# Patient Record
Sex: Female | Born: 2005
Health system: Southern US, Community
[De-identification: ages and names within clinical notes are randomized; demographics above are authoritative.]

## PROBLEM LIST (undated history)

## (undated) DIAGNOSIS — T7840XA Allergy, unspecified, initial encounter: Secondary | ICD-10-CM

## (undated) DIAGNOSIS — F419 Anxiety disorder, unspecified: Secondary | ICD-10-CM

## (undated) DIAGNOSIS — Z789 Other specified health status: Secondary | ICD-10-CM

## (undated) HISTORY — DX: Allergy, unspecified, initial encounter: T78.40XA

## (undated) HISTORY — DX: Anxiety disorder, unspecified: F41.9

## (undated) HISTORY — PX: TONSILECTOMY, ADENOIDECTOMY, BILATERAL MYRINGOTOMY AND TUBES: SHX2538

---
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
## 2024-01-07 ENCOUNTER — Other Ambulatory Visit: Payer: Self-pay

## 2024-01-07 ENCOUNTER — Other Ambulatory Visit (HOSPITAL_COMMUNITY): Payer: Self-pay

## 2024-01-07 MED ORDER — VALACYCLOVIR HCL 1 G PO TABS
2.0000 g | ORAL_TABLET | Freq: Two times a day (BID) | ORAL | 0 refills | Status: DC
  Filled 2024-01-07: qty 4, 1d supply, fill #0

## 2024-01-08 ENCOUNTER — Other Ambulatory Visit (HOSPITAL_COMMUNITY): Payer: Self-pay

## 2024-01-09 ENCOUNTER — Other Ambulatory Visit (HOSPITAL_COMMUNITY): Payer: Self-pay

## 2024-01-09 ENCOUNTER — Other Ambulatory Visit: Payer: Self-pay

## 2024-01-09 MED ORDER — VALACYCLOVIR HCL 1 G PO TABS
2000.0000 mg | ORAL_TABLET | Freq: Two times a day (BID) | ORAL | 4 refills | Status: AC
  Filled 2024-01-09: qty 20, 5d supply, fill #0
  Filled 2024-01-10: qty 20, 5d supply, fill #1
  Filled 2024-01-15 – 2024-01-20 (×3): qty 20, 5d supply, fill #2
  Filled 2024-01-21: qty 20, 5d supply, fill #3

## 2024-01-14 ENCOUNTER — Other Ambulatory Visit (HOSPITAL_COMMUNITY): Payer: Self-pay

## 2024-01-15 ENCOUNTER — Other Ambulatory Visit (HOSPITAL_COMMUNITY): Payer: Self-pay

## 2024-01-16 ENCOUNTER — Other Ambulatory Visit (HOSPITAL_COMMUNITY): Payer: Self-pay

## 2024-01-21 ENCOUNTER — Encounter: Payer: Self-pay | Admitting: Family

## 2024-01-21 ENCOUNTER — Ambulatory Visit (INDEPENDENT_AMBULATORY_CARE_PROVIDER_SITE_OTHER): Admitting: Family

## 2024-01-21 ENCOUNTER — Other Ambulatory Visit (HOSPITAL_COMMUNITY): Payer: Self-pay

## 2024-01-21 VITALS — BP 114/77 | HR 78 | Temp 97.5°F | Ht 65.0 in | Wt 146.5 lb

## 2024-01-21 DIAGNOSIS — Z13220 Encounter for screening for lipoid disorders: Secondary | ICD-10-CM | POA: Diagnosis not present

## 2024-01-21 DIAGNOSIS — Z1159 Encounter for screening for other viral diseases: Secondary | ICD-10-CM | POA: Diagnosis not present

## 2024-01-21 DIAGNOSIS — R41840 Attention and concentration deficit: Secondary | ICD-10-CM | POA: Diagnosis not present

## 2024-01-21 DIAGNOSIS — B001 Herpesviral vesicular dermatitis: Secondary | ICD-10-CM | POA: Diagnosis not present

## 2024-01-21 DIAGNOSIS — Z0000 Encounter for general adult medical examination without abnormal findings: Secondary | ICD-10-CM

## 2024-01-21 LAB — CBC WITH DIFFERENTIAL/PLATELET
Basophils Absolute: 0 K/uL (ref 0.0–0.1)
Basophils Relative: 0.5 % (ref 0.0–3.0)
Eosinophils Absolute: 0.1 K/uL (ref 0.0–0.7)
Eosinophils Relative: 2.6 % (ref 0.0–5.0)
HCT: 38.2 % (ref 36.0–49.0)
Hemoglobin: 12.8 g/dL (ref 12.0–16.0)
Lymphocytes Relative: 29.2 % (ref 24.0–48.0)
Lymphs Abs: 1.6 K/uL (ref 0.7–4.0)
MCHC: 33.6 g/dL (ref 31.0–37.0)
MCV: 87.5 fl (ref 78.0–98.0)
Monocytes Absolute: 0.3 K/uL (ref 0.1–1.0)
Monocytes Relative: 5.4 % (ref 3.0–12.0)
Neutro Abs: 3.5 K/uL (ref 1.4–7.7)
Neutrophils Relative %: 62.3 % (ref 43.0–71.0)
Platelets: 337 K/uL (ref 150.0–575.0)
RBC: 4.37 Mil/uL (ref 3.80–5.70)
RDW: 13.2 % (ref 11.4–15.5)
WBC: 5.6 K/uL (ref 4.5–13.5)

## 2024-01-21 LAB — COMPREHENSIVE METABOLIC PANEL WITH GFR
ALT: 9 U/L (ref 0–35)
AST: 15 U/L (ref 0–37)
Albumin: 4 g/dL (ref 3.5–5.2)
Alkaline Phosphatase: 42 U/L — ABNORMAL LOW (ref 47–119)
BUN: 11 mg/dL (ref 6–23)
CO2: 27 meq/L (ref 19–32)
Calcium: 9.2 mg/dL (ref 8.4–10.5)
Chloride: 102 meq/L (ref 96–112)
Creatinine, Ser: 0.82 mg/dL (ref 0.40–1.20)
GFR: 103.83 mL/min (ref >60.00)
Glucose, Bld: 81 mg/dL (ref 70–99)
Potassium: 4.2 meq/L (ref 3.5–5.1)
Sodium: 135 meq/L (ref 135–145)
Total Bilirubin: 0.5 mg/dL (ref 0.2–1.2)
Total Protein: 7.6 g/dL (ref 6.0–8.3)

## 2024-01-21 LAB — TSH: TSH: 1.8 u[IU]/mL (ref 0.40–5.00)

## 2024-01-21 LAB — LIPID PANEL
Cholesterol: 242 mg/dL — ABNORMAL HIGH (ref 0–200)
HDL: 58.5 mg/dL (ref >39.00)
LDL Cholesterol: 149 mg/dL — ABNORMAL HIGH (ref 0–99)
NonHDL: 183.26
Total CHOL/HDL Ratio: 4
Triglycerides: 172 mg/dL — ABNORMAL HIGH (ref 0.0–149.0)
VLDL: 34.4 mg/dL (ref 0.0–40.0)

## 2024-01-21 NOTE — Patient Instructions (Addendum)
 It was very nice to see you today!   I will review your lab results via MyChart in a few days.  If you want to talk more about ADHD you can schedule a follow up visit when convenient.  You look great! Stay well!      PLEASE NOTE:  If you had any lab tests please let us  know if you have not heard back within a few days. You may see your results on MyChart before we have a chance to review them but we will give you a call once they are reviewed by us . If we ordered any referrals today, please let us  know if you have not heard from their office within the next week.

## 2024-01-21 NOTE — Progress Notes (Signed)
 Phone [phone]  Subjective:   Patient is a 18 y.o. female presenting for annual physical.    Chief Complaint  Patient presents with   New Patient (Initial Visit)   Annual Exam    Fasting w/ labs  Discussed the use of AI scribe software for clinical note transcription with the patient, who gave verbal consent to proceed.  History of Present Illness Katherine Vincent is a 18 year old female who presents for an annual physical exam.  Menstrual cycle and contraceptive management - Takes Sprintec  daily for menstrual pain management and cycle regulation - Plans for possible Pap smear in early August - Believes HPV vaccine series completed, just one vaccine showing in our EMR  Herpetic labialis prophylaxis - Uses Valtrex  prophylactically to prevent lip flare-ups associated with sun exposure  Attention and concentration difficulties - Experiences difficulty sitting still and focusing - Symptoms have become more noticeable since starting college  General health maintenance - Maintains regular bowel movements - Aims to drink two liters of water daily - Limits alcohol intake - No tobacco or vaping use  See problem oriented charting- ROS- full  review of systems was completed and negative except for what is noted in HPI above.  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  Past Surgical History:  Procedure Laterality Date   TONSILECTOMY, ADENOIDECTOMY, BILATERAL MYRINGOTOMY AND TUBES      Family History  Problem Relation Age of Onset   Hypertension Mother    Arthritis Mother    Hypertension Father    Arthritis Father    Heart disease Father    Stroke Father    ADD / ADHD Sister    Asthma Maternal Aunt    Diabetes  Maternal Aunt    Miscarriages / Stillbirths Maternal Aunt    Cancer Paternal Uncle    Alcohol abuse Paternal Uncle    Cancer Maternal Grandmother    Diabetes Maternal Grandmother    Hearing loss Maternal Grandmother    Alcohol abuse Maternal Grandfather    Cancer Maternal Grandfather    Cancer Paternal Grandmother    Heart disease Paternal Grandfather     Medications- reviewed and updated Current Outpatient Medications  Medication Sig Dispense Refill   SPRINTEC  28 0.25-35 MG-MCG tablet Take 1 tablet by mouth daily. 84 tablet 2   SPRINTEC  28 0.25-35 MG-MCG tablet Take 1 tablet by mouth daily. 84 tablet 1   valACYclovir  (VALTREX ) 1000 MG tablet Take 2 tablets (2,000 mg total) by mouth 2 (two) times daily. 20 tablet 4   valACYclovir  (VALTREX ) 1000 MG tablet Take 2 tablets (2,000 mg total) by mouth 2 (two) times daily. 20 tablet 4   No current facility-administered medications for this visit.    Allergies-reviewed and updated Allergies  Allergen Reactions   Norgestimate -Eth Estradiol      negative mood effects   Amoxicillin Rash    Did it involve swelling of the face/tongue/throat, SOB, or low BP? No Did it involve sudden or severe rash/hives, skin peeling, or any reaction on the inside of your mouth or nose? No Did you need to seek medical attention at a hospital or doctor's office? No When did it last happen?  >10years (about 72yrs of age)  If all above answers are "NO", may proceed with cephalosporin use.     Social History   Social History Narrative   Katherine Vincent lives at home with her mother and father. Pets in home include 1 dog.     Objective:  BP 114/77 (BP Location: Left Arm, Patient Position: Sitting, Cuff Size: Large)   Pulse 78   Temp (!) 97.5 F (36.4 C) (Temporal)   Ht 5' 5 (1.651 m)   Wt 146 lb 8 oz (66.5 kg)   LMP 12/22/2023 (Approximate)   BMI 24.38 kg/m  Physical Exam Vitals and nursing note reviewed.  Constitutional:      Appearance: Normal  appearance.  HENT:     Head: Normocephalic.     Right Ear: Tympanic membrane normal.     Left Ear: Tympanic membrane normal.     Nose: Nose normal.     Mouth/Throat:     Mouth: Mucous membranes are moist.  Eyes:     Pupils: Pupils are equal, round, and reactive to light.  Cardiovascular:     Rate and Rhythm: Normal rate and regular rhythm.  Pulmonary:     Effort: Pulmonary effort is normal.     Breath sounds: Normal breath sounds.  Musculoskeletal:        General: Normal range of motion.     Cervical back: Normal range of motion.  Lymphadenopathy:     Cervical: No cervical adenopathy.  Skin:    General: Skin is warm and dry.  Neurological:     Mental Status: She is alert.  Psychiatric:        Mood and Affect: Mood normal.        Behavior: Behavior normal.     Assessment and Plan   Health Maintenance counseling: 1. Anticipatory guidance: Patient counseled regarding regular dental exams q6 months, eye exams,  avoiding smoking and second hand smoke, limiting alcohol to 1 beverage per day, no illicit drugs.   2. Risk factor reduction:  Advised patient of need for regular exercise and diet rich with fruits and vegetables to reduce risk of heart attack and stroke. Wt Readings from Last 3 Encounters:  01/21/24 146 lb 8 oz (66.5 kg) (78%, Z= 0.78)*  08/07/19 125 lb 3.5 oz (56.8 kg) (69%, Z= 0.51)*  08/15/16 120 lb 3.2 oz (54.5 kg) (90%, Z= 1.28)*   * Growth percentiles are based on CDC (Girls, 2-20 Years) data.   3. Immunizations/screenings/ancillary studies Immunization History  Administered Date(s) Administered   DTaP 01/02/2005, 03/07/2005, 05/09/2005, 03/19/2006, 11/01/2008   HIB (PRP-OMP) 01/02/2005, 03/07/2005, 03/19/2006   HPV 9-valent 06/07/2016, 12/12/2016   Hepatitis A, Ped/Adol-2 Dose 10/26/2005, 10/30/2006   Hepatitis B 06-15-2005, 11/28/2004, 08/10/2005   IPV 01/02/2005, 03/07/2005, 08/10/2005, 11/01/2008   Influenza Inj Mdck Quad Pf 06/09/2018    Influenza,inj,Quad PF,6+ Mos 04/28/2015   Influenza-Unspecified 06/23/2019   MMR 10/26/2005, 11/01/2008   MenQuadfi_Meningococcal Groups ACYW Conjugate 12/06/2021   Meningococcal B, OMV 12/06/2021, 12/07/2022   Pneumococcal Conjugate PCV 7 01/02/2005, 03/07/2005, 05/09/2005, 10/26/2005   Tdap 06/07/2016   Varicella 10/26/2005, 11/01/2008   There are no preventive care reminders to display for this patient.   4. Cervical cancer screening: never - est with GYN, will discuss starting in Aug 5. Skin cancer screening- advised regular sunscreen use. Denies worrisome, changing, or new skin lesions.  6. Birth control/STD check: OCPs 7. Smoking associated screening: never smoked 8. Alcohol screening: rare   Assessment & Plan Attention Deficit Hyperactivity Disorder (ADHD) - Suspected Discussed potential stimulant  medications, side effects, and monitoring requirements. Reviewed non-pharmacological strategies for focus and attention. - Provided educational material on focus and attention strategies. - Offer ADHD screening in a future visit. - Discuss stimulant medications and side effects in a follow-up visit.  Herpes Labialis Uses Valtrex  prophylactically during sun exposure to prevent outbreaks. - Refill Valtrex  prescription when needed for prophylactic use during sun exposure.  General Health Maintenance 18 year old female, on Sprintec  for contraception, sexually active, plans for Pap smear in August, completed HPV vaccination. Vitals normal. - Perform blood work including cholesterol panel, thyroid, metabolic panel, liver and kidney function, electrolytes, calcium, protein, blood count, and hepatitis C screening. - Advised on maintaining regular bowel movements and adequate hydration. - Counsel on limiting alcohol intake and avoiding smoking, vaping, and illegal substances. - Encourage safety practices when out socially.  Recommended follow up:  Return for any future concerns, Complete  physical w/fasting labs. No future appointments.   Lab/Order associations: fasting    Lucius Krabbe, NP

## 2024-01-22 ENCOUNTER — Ambulatory Visit: Payer: Self-pay | Admitting: Family

## 2024-01-22 LAB — HEPATITIS C ANTIBODY: Hepatitis C Ab: NONREACTIVE

## 2024-02-17 ENCOUNTER — Other Ambulatory Visit (HOSPITAL_COMMUNITY): Payer: Self-pay

## 2024-02-25 ENCOUNTER — Other Ambulatory Visit: Payer: Self-pay

## 2024-05-29 ENCOUNTER — Other Ambulatory Visit (HOSPITAL_COMMUNITY): Payer: Self-pay

## 2024-06-01 ENCOUNTER — Other Ambulatory Visit (HOSPITAL_COMMUNITY): Payer: Self-pay

## 2024-06-03 ENCOUNTER — Other Ambulatory Visit (HOSPITAL_BASED_OUTPATIENT_CLINIC_OR_DEPARTMENT_OTHER): Payer: Self-pay

## 2024-06-03 MED ORDER — FLUZONE 0.5 ML IM SUSY
0.5000 mL | PREFILLED_SYRINGE | Freq: Once | INTRAMUSCULAR | 0 refills | Status: AC
  Filled 2024-06-03: qty 0.5, 1d supply, fill #0

## 2024-06-04 ENCOUNTER — Telehealth: Admitting: Family Medicine

## 2024-06-04 DIAGNOSIS — R6889 Other general symptoms and signs: Secondary | ICD-10-CM

## 2024-06-05 ENCOUNTER — Other Ambulatory Visit (HOSPITAL_COMMUNITY): Payer: Self-pay

## 2024-06-05 ENCOUNTER — Other Ambulatory Visit: Payer: Self-pay

## 2024-06-05 MED ORDER — LIDOCAINE VISCOUS HCL 2 % MT SOLN
10.0000 mL | OROMUCOSAL | 0 refills | Status: AC | PRN
  Filled 2024-06-05 (×2): qty 200, 7d supply, fill #0

## 2024-06-05 MED ORDER — PSEUDOEPH-BROMPHEN-DM 30-2-10 MG/5ML PO SYRP
5.0000 mL | ORAL_SOLUTION | Freq: Four times a day (QID) | ORAL | 0 refills | Status: AC | PRN
  Filled 2024-06-05 (×2): qty 120, 6d supply, fill #0

## 2024-06-05 NOTE — Progress Notes (Signed)
 E visit for Flu like symptoms   We are sorry that you are not feeling well.  Here is how we plan to help! Based on what you have shared with me it looks like you may have flu-like symptoms that should be watched but do not seem to indicate anti-viral treatment.  Influenza or "the flu" is  an infection caused by a respiratory virus. The flu virus is highly contagious and persons who did not receive their yearly flu vaccination may "catch" the flu from close contact.  We have anti-viral medications to treat the viruses that cause this infection. They are not a "cure" and only shorten the course of the infection. These prescriptions are most effective when they are given within the first 2 days of "flu" symptoms. Antiviral medications are indicated if you have a high risk of complications from the flu. You should  also consider an antiviral medication if you are in close contact with someone who is at risk. These medications can help patients avoid complications from the flu but have side effects that you should know.   Possible side effects from Tamiflu or oseltamivir include nausea, vomiting, diarrhea, dizziness, headaches, eye redness, sleep problems or other respiratory symptoms. You should not take Tamiflu if you have an allergy to oseltamivir or any to the ingredients in Tamiflu.   For nasal congestion, you may use an oral decongestant such as Mucinex D or if you have glaucoma or high blood pressure use plain Mucinex.  Saline nasal spray or nasal drops can help and can safely be used as often as needed for congestion.  If you have a sore or scratchy throat, use a saltwater gargle-  to  teaspoon of salt dissolved in a 4-ounce to 8-ounce glass of warm water.  Gargle the solution for approximately 15-30 seconds and then spit.  It is important not to swallow the solution.  You can also use throat lozenges/cough drops and Chloraseptic spray to help with throat pain or discomfort.  Warm or cold liquids  can also be helpful in relieving throat pain.  For headache, pain or general discomfort, you can use Ibuprofen or Tylenol as directed.   Some authorities believe that zinc sprays or the use of Echinacea may shorten the course of your symptoms.  I have prescribed the following medications to help lessen symptoms: For cough I have prescribed for you A prescription cough medication called Bromfed-DM 2-30-10 mg/5 mL.  Take 5 mL every 6 hours as needed for cough.  If you have a sore or scratchy throat, use a saltwater gargle-  to  teaspoon of salt dissolved in a 4-ounce to 8-ounce glass of warm water.  Gargle the solution for approximately 15-30 seconds and then spit.  It is important not to swallow the solution.  You can also use throat lozenges/cough drops and Chloraseptic spray to help with throat pain or discomfort.  Warm or cold liquids can also be helpful in relieving throat pain. I have also prescribed I have prescribed a Viscous Lidocaine  2% solution. Swallow 5-10 mL every 4-6 hours as needed for sore throat. DO NOT eat or drink anything for 15-20 minutes after swallowing to allow the medication to coat the throat.  You are to isolate at home until you have been fever-free for at least 24 hours without a fever-reducing medication, and symptoms have been steadily improving for 24 hours.  If you must be around other household members who do not have symptoms, you need to make sure  that both you and the family members are masking consistently with a high-quality mask.  If you note any worsening of symptoms despite treatment, please seek an in-person evaluation ASAP. If you note any significant shortness of breath or any chest pain, please seek ED evaluation. Please do not delay care!  ANYONE WHO HAS FLU SYMPTOMS SHOULD: Stay home. The flu is highly contagious and going out or to work exposes others! Be sure to drink plenty of fluids. Water is fine as well as fruit juices, sodas and electrolyte  beverages. You may want to stay away from caffeine or alcohol. If you are nauseated, try taking small sips of liquids. How do you know if you are getting enough fluid? Your urine should be a pale yellow or almost colorless. Get rest. Taking a steamy shower or using a humidifier may help nasal congestion and ease sore throat pain. Using a saline nasal spray works much the same way. Cough drops, hard candies and sore throat lozenges may ease your cough. Line up a caregiver. Have someone check on you regularly.  GET HELP RIGHT AWAY IF: You cannot keep down liquids or your medications. You become short of breath Your fell like you are going to pass out or loose consciousness. Your symptoms persist after you have completed your treatment plan  MAKE SURE YOU  Understand these instructions. Will watch your condition. Will get help right away if you are not doing well or get worse.  Your e-visit answers were reviewed by a board certified advanced clinical practitioner to complete your personal care plan.  Depending on the condition, your plan could have included both over the counter or prescription medications.  If there is a problem please reply  once you have received a response from your provider.  Your safety is important to us .  If you have drug allergies check your prescription carefully.    You can use MyChart to ask questions about today's visit, request a non-urgent call back, or ask for a work or school excuse for 24 hours related to this e-Visit. If it has been greater than 24 hours you will need to follow up with your provider, or enter a new e-Visit to address those concerns.  You will get an e-mail in the next two days asking about your experience.  I hope that your e-visit has been valuable and will speed your recovery. Thank you for using e-visits.   I have spent 5 minutes in review of e-visit questionnaire, review and updating patient chart, medical decision making and response  to patient.   Roosvelt Mater, PA-C

## 2024-06-26 DIAGNOSIS — Z01419 Encounter for gynecological examination (general) (routine) without abnormal findings: Secondary | ICD-10-CM | POA: Diagnosis not present

## 2024-06-26 MED ORDER — SPRINTEC 28 0.25-35 MG-MCG PO TABS: 1.0000 | ORAL_TABLET | Freq: Every day | ORAL | 4 refills | Status: AC

## 2024-07-24 ENCOUNTER — Other Ambulatory Visit (HOSPITAL_COMMUNITY): Payer: Self-pay

## 2024-08-07 ENCOUNTER — Telehealth
